# Patient Record
Sex: Female | Born: 2000 | Race: White | Hispanic: No | Marital: Single
Health system: Southern US, Community
[De-identification: ages and names within clinical notes are randomized; demographics above are authoritative.]

---
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
## 2019-10-13 ENCOUNTER — Ambulatory Visit: Payer: Self-pay | Attending: Internal Medicine

## 2019-10-13 ENCOUNTER — Other Ambulatory Visit: Payer: Self-pay

## 2019-10-13 DIAGNOSIS — Z20822 Contact with and (suspected) exposure to covid-19: Secondary | ICD-10-CM

## 2019-10-13 DIAGNOSIS — U071 COVID-19: Secondary | ICD-10-CM | POA: Insufficient documentation

## 2019-10-14 LAB — NOVEL CORONAVIRUS, NAA: SARS-CoV-2, NAA: DETECTED — AB

## 2020-10-12 ENCOUNTER — Ambulatory Visit: Payer: Self-pay

## 2020-12-20 ENCOUNTER — Ambulatory Visit: Payer: Self-pay | Attending: Internal Medicine

## 2020-12-20 DIAGNOSIS — Z23 Encounter for immunization: Secondary | ICD-10-CM

## 2020-12-20 NOTE — Progress Notes (Signed)
   Covid-19 Vaccination Clinic  Name:  Brandi Hicks    MRN: [id number] DOB: 17-Aug-2001  12/20/2020  [name] was observed post Covid-19 immunization for 15 minutes without incident. She was provided with Vaccine Information Sheet and instruction to access the V-Safe system.   [name] was instructed to call 911 with any severe reactions post vaccine: Marland Kitchen Difficulty breathing  . Swelling of face and throat  . A fast heartbeat  . A bad rash all over body  . Dizziness and weakness   Immunizations Administered    Name Date Dose VIS Date Route   Moderna Covid-19 Booster Vaccine 12/20/2020  2:10 PM 0.25 mL 07/24/2020 Intramuscular   Manufacturer: Moderna   Lot: [id number]   NDC: 57017-793-90

## 2023-09-01 ENCOUNTER — Telehealth: Payer: BC Managed Care – PPO | Admitting: Physician Assistant

## 2023-09-01 DIAGNOSIS — J208 Acute bronchitis due to other specified organisms: Secondary | ICD-10-CM | POA: Diagnosis not present

## 2023-09-01 DIAGNOSIS — J4521 Mild intermittent asthma with (acute) exacerbation: Secondary | ICD-10-CM | POA: Diagnosis not present

## 2023-09-01 DIAGNOSIS — B9689 Other specified bacterial agents as the cause of diseases classified elsewhere: Secondary | ICD-10-CM

## 2023-09-01 MED ORDER — BENZONATATE 100 MG PO CAPS: 100.0000 mg | ORAL_CAPSULE | Freq: Three times a day (TID) | ORAL | 0 refills | Status: AC | PRN

## 2023-09-01 MED ORDER — PREDNISONE 20 MG PO TABS: 40.0000 mg | ORAL_TABLET | Freq: Every day | ORAL | 0 refills | Status: AC

## 2023-09-01 MED ORDER — AZITHROMYCIN 250 MG PO TABS: ORAL_TABLET | ORAL | 0 refills | Status: AC

## 2023-09-01 NOTE — Progress Notes (Signed)
E-Visit for Cough   We are sorry that you are not feeling well.  Here is how we plan to help!  Based on your presentation I believe you most likely have A cough due to bacteria.  When patients have a fever and a productive cough with a change in color or increased sputum production, we are concerned about bacterial bronchitis.  If left untreated it can progress to pneumonia.  If your symptoms do not improve with your treatment plan it is important that you contact your provider.   I have prescribed Azithromyin 250 mg: two tablets now and then one tablet daily for 4 additonal days    In addition you may use A non-prescription cough medication called Mucinex DM: take 2 tablets every 12 hours. and A prescription cough medication called Tessalon Perles 100mg . You may take 1-2 capsules every 8 hours as needed for your cough.  I have also prescribed Prednisone 20mg  Take 2 tablets (40mg ) daily for 5 days for asthma exacerbation. Continue rescue inhaler as needed.   From your responses in the eVisit questionnaire you describe inflammation in the upper respiratory tract which is causing a significant cough.  This is commonly called Bronchitis and has four common causes:   Allergies Viral Infections Acid Reflux Bacterial Infection Allergies, viruses and acid reflux are treated by controlling symptoms or eliminating the cause. An example might be a cough caused by taking certain blood pressure medications. You stop the cough by changing the medication. Another example might be a cough caused by acid reflux. Controlling the reflux helps control the cough.  USE OF BRONCHODILATOR ("RESCUE") INHALERS: There is a risk from using your bronchodilator too frequently.  The risk is that over-reliance on a medication which only relaxes the muscles surrounding the breathing tubes can reduce the effectiveness of medications prescribed to reduce swelling and congestion of the tubes themselves.  Although you feel brief  relief from the bronchodilator inhaler, your asthma may actually be worsening with the tubes becoming more swollen and filled with mucus.  This can delay other crucial treatments, such as oral steroid medications. If you need to use a bronchodilator inhaler daily, several times per day, you should discuss this with your provider.  There are probably better treatments that could be used to keep your asthma under control.     HOME CARE Only take medications as instructed by your medical team. Complete the entire course of an antibiotic. Drink plenty of fluids and get plenty of rest. Avoid close contacts especially the very young and the elderly Cover your mouth if you cough or cough into your sleeve. Always remember to wash your hands A steam or ultrasonic humidifier can help congestion.   GET HELP RIGHT AWAY IF: You develop worsening fever. You become short of breath You cough up blood. Your symptoms persist after you have completed your treatment plan MAKE SURE YOU  Understand these instructions. Will watch your condition. Will get help right away if you are not doing well or get worse.    Thank you for choosing an e-visit.  Your e-visit answers were reviewed by a board certified advanced clinical practitioner to complete your personal care plan. Depending upon the condition, your plan could have included both over the counter or prescription medications.  Please review your pharmacy choice. Make sure the pharmacy is open so you can pick up prescription now. If there is a problem, you may contact your provider through MyChart messaging and have the prescription routed to  another pharmacy.  Your safety is important to [place]. If you have drug allergies check your prescription carefully.   For the next 24 hours you can use MyChart to ask questions about today's visit, request a non-urgent call back, or ask for a work or school excuse. You will get an email in the next two days asking about  your experience. I hope that your e-visit has been valuable and will speed your recovery.   I have spent 5 minutes in review of e-visit questionnaire, review and updating patient chart, medical decision making and response to patient.   Margaretann Loveless, PA-C
